# Patient Record
Sex: Male | Born: 2000 | Race: White | Hispanic: No | Marital: Single
Health system: Southern US, Community
[De-identification: ages and names within clinical notes are randomized; demographics above are authoritative.]

## PROBLEM LIST (undated history)

## (undated) DIAGNOSIS — K589 Irritable bowel syndrome without diarrhea: Secondary | ICD-10-CM

## (undated) HISTORY — DX: Irritable bowel syndrome without diarrhea: K58.9

## (undated) HISTORY — PX: FRACTURE SURGERY: SHX138

---
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
## 2021-01-19 ENCOUNTER — Other Ambulatory Visit: Payer: Self-pay | Admitting: Family Medicine

## 2021-01-19 DIAGNOSIS — K219 Gastro-esophageal reflux disease without esophagitis: Secondary | ICD-10-CM

## 2021-02-16 ENCOUNTER — Other Ambulatory Visit: Payer: Self-pay | Admitting: Family Medicine

## 2021-02-16 DIAGNOSIS — K219 Gastro-esophageal reflux disease without esophagitis: Secondary | ICD-10-CM

## 2021-03-17 ENCOUNTER — Encounter: Payer: Self-pay | Admitting: Nurse Practitioner

## 2021-03-17 ENCOUNTER — Ambulatory Visit (INDEPENDENT_AMBULATORY_CARE_PROVIDER_SITE_OTHER): Payer: BC Managed Care – PPO | Admitting: Nurse Practitioner

## 2021-03-17 ENCOUNTER — Other Ambulatory Visit: Payer: Self-pay

## 2021-03-17 VITALS — BP 132/76 | HR 116 | Temp 97.3°F | Ht 70.0 in | Wt 188.0 lb

## 2021-03-17 DIAGNOSIS — H9202 Otalgia, left ear: Secondary | ICD-10-CM

## 2021-03-17 DIAGNOSIS — H6121 Impacted cerumen, right ear: Secondary | ICD-10-CM

## 2021-03-17 MED ORDER — DEBROX 6.5 % OT SOLN: 5.0000 [drp] | Freq: Two times a day (BID) | OTIC | 0 refills | Status: DC

## 2021-03-17 MED ORDER — NEOMYCIN-POLYMYXIN-HC 3.5-10000-1 OT SOLN: 3.0000 [drp] | Freq: Four times a day (QID) | OTIC | 0 refills | Status: DC

## 2021-03-17 NOTE — Assessment & Plan Note (Signed)
Left ear pain not well controlled in the last 24 hours.  Patient reports going to sleep and waking up with a hearing loss.  Assess patient's ear canal tympanic membrane erythematous.  Patient reports straining area excess water trying to flush her ear.  I suspect patient's ear was full with water and because his hearing loss.  Right ear has cerumen impaction.  Ears disimpacted in clinic.  Education provided to patient with printed handouts given.  Advised patient to use Tylenol or ibuprofen for pain.  Neomycin eardrops sent to pharmacy.  Patient knows to follow-up with worsening unresolved symptoms.

## 2021-03-17 NOTE — Patient Instructions (Signed)

## 2021-03-17 NOTE — Progress Notes (Signed)
Acute Office Visit  Subjective:    Patient ID: Kenneth Kim, male    DOB: 05-Sep-2001, 20 y.o.   MRN: [id number]  Chief Complaint  Patient presents with  . Otalgia    Otalgia  There is pain in the left ear. This is a new problem. The current episode started yesterday. The problem occurs every few minutes. The problem has been unchanged. There has been no fever. The pain is mild. Associated symptoms include hearing loss. Pertinent negatives include no coughing, headaches or sore throat. He has tried nothing for the symptoms. There is no history of a chronic ear infection.     Past Medical History:  Diagnosis Date  . IBS (irritable bowel syndrome) 2016    Past Surgical History:  Procedure Laterality Date  . ELBOW FRACTURE SURGERY Left 2006  . FRACTURE SURGERY      Family History  Problem Relation Age of Onset  . Cancer Father        kidney 2014  . Hyperlipidemia Mother   . Diabetes Maternal Grandmother   . Irritable bowel syndrome Maternal Grandmother     Social History   Socioeconomic History  . Marital status: Single    Spouse name: Not on file  . Number of children: Not on file  . Years of education: Not on file  . Highest education level: Not on file  Occupational History  . Not on file  Tobacco Use  . Smoking status: Passive Smoke Exposure - Never Smoker  . Smokeless tobacco: Never Used  Vaping Use  . Vaping Use: Never used  Substance and Sexual Activity  . Alcohol use: No  . Drug use: No  . Sexual activity: Not on file  Other Topics Concern  . Not on file  Social History Narrative   10th grade   Social Determinants of Health   Financial Resource Strain: Not on file  Food Insecurity: Not on file  Transportation Needs: Not on file  Physical Activity: Not on file  Stress: Not on file  Social Connections: Not on file  Intimate Partner Violence: Not on file    Outpatient Medications Prior to Visit  Medication Sig Dispense Refill  .  cetirizine (ZYRTEC) 10 MG tablet TAKE ONE (1) TABLET EACH DAY 30 tablet 5  . omeprazole (PRILOSEC) 20 MG capsule Take 1 capsule (20 mg total) by mouth 2 (two) times daily before a meal. (Needs to be seen before next refill) 60 capsule 0  . linaclotide (LINZESS) 290 MCG CAPS capsule Take 1 capsule (290 mcg total) by mouth daily before breakfast. 90 capsule 1  . ondansetron (ZOFRAN ODT) 4 MG disintegrating tablet Take 1 tablet (4 mg total) by mouth every 8 (eight) hours as needed for nausea or vomiting. 20 tablet 0  . polyethylene glycol powder (GLYCOLAX/MIRALAX) powder Take 17 g by mouth daily. 850 g 1   No facility-administered medications prior to visit.     Review of Systems  HENT: Positive for ear pain and hearing loss. Negative for sore throat.   Respiratory: Negative for cough.   Genitourinary: Negative.   Neurological: Negative for headaches.  Psychiatric/Behavioral: Behavioral problem:   All other systems reviewed and are negative.      Objective:    Physical Exam Vitals and nursing note reviewed.  Constitutional:      Appearance: Normal appearance.  HENT:     Head: Normocephalic.     Right Ear: There is impacted cerumen.     Ears:  Comments: Inflamed ear canal and space around tympanic membrane    Nose: Nose normal.  Eyes:     Conjunctiva/sclera: Conjunctivae normal.  Cardiovascular:     Pulses: Normal pulses.     Heart sounds: Normal heart sounds.  Pulmonary:     Effort: Pulmonary effort is normal.     Breath sounds: Normal breath sounds.  Neurological:     Mental Status: He is alert and oriented to person, place, and time.     BP 132/76   Pulse (!) 116   Temp (!) 97.3 F (36.3 C) (Temporal)   Ht 5\' 10"  (1.778 m)   Wt 188 lb (85.3 kg)   SpO2 99%   BMI 26.98 kg/m  Wt Readings from Last 3 Encounters:  03/17/21 188 lb (85.3 kg) (89 %, Z= 1.24)*  01/10/19 202 lb 3.2 oz (91.7 kg) (97 %, Z= 1.95)*  01/04/19 202 lb 2 oz (91.7 kg) (97 %, Z= 1.95)*   *  Growth percentiles are based on CDC (Boys, 2-20 Years) data.    Health Maintenance Due  Topic Date Due  . Hepatitis C Screening  Never done  . HPV VACCINES (1 - Male 2-dose series) Never done  . HIV Screening  Never done       Topic Date Due  . HPV VACCINES (1 - Male 2-dose series) Never done     Lab Results  Component Value Date   TSH 1.920 06/27/2018   Lab Results  Component Value Date   WBC 6.0 07/20/2018   HGB 15.3 07/20/2018   HCT 44.5 07/20/2018   MCV 78 (L) 07/20/2018   PLT 327 07/20/2018   Lab Results  Component Value Date   NA 141 06/27/2018   K 4.7 06/27/2018   CO2 26 06/27/2018   GLUCOSE 71 06/27/2018   BUN 10 06/27/2018   CREATININE 1.00 06/27/2018   BILITOT 0.4 06/27/2018   ALKPHOS 126 06/27/2018   AST 21 06/27/2018   ALT 24 06/27/2018   PROT 7.4 06/27/2018   ALBUMIN 4.8 06/27/2018   CALCIUM 10.0 06/27/2018   Lab Results  Component Value Date   CHOL 154 09/21/2015   Lab Results  Component Value Date   HDL 52 09/21/2015   Lab Results  Component Value Date   LDLCALC 72 09/21/2015   Lab Results  Component Value Date   TRIG 148 (H) 09/21/2015   Lab Results  Component Value Date   CHOLHDL 3.0 09/21/2015   Lab Results  Component Value Date   HGBA1C 5.2 06/27/2018       Assessment & Plan:  Left ear pain Left ear pain not well controlled in the last 24 hours.  Patient reports going to sleep and waking up with a hearing loss.  Assess patient's ear canal tympanic membrane erythematous.  Patient reports straining area excess water trying to flush her ear.  I suspect patient's ear was full with water and because his hearing loss.  Right ear has cerumen impaction.  Ears disimpacted in clinic.  Education provided to patient with printed handouts given.  Advised patient to use Tylenol or ibuprofen for pain.  Neomycin eardrops sent to pharmacy.  Patient knows to follow-up with worsening unresolved symptoms.  Problem List Items Addressed This  Visit      Nervous and Auditory   Impacted cerumen of right ear   Relevant Medications   carbamide peroxide (DEBROX) 6.5 % OTIC solution     Other   Left ear pain - Primary  Relevant Medications   neomycin-polymyxin-hydrocortisone (CORTISPORIN) OTIC solution       Meds ordered this encounter  Medications  . neomycin-polymyxin-hydrocortisone (CORTISPORIN) OTIC solution    Sig: Place 3 drops into the left ear 4 (four) times daily.    Dispense:  4 mL    Refill:  0    Order Specific Question:   Supervising Provider    Answer:   Raliegh Ip [[id number]]  . carbamide peroxide (DEBROX) 6.5 % OTIC solution    Sig: Place 5 drops into the right ear 2 (two) times daily.    Dispense:  15 mL    Refill:  0    Order Specific Question:   Supervising Provider    Answer:   Raliegh Ip [[id number]]     Daryll Drown, NP

## 2021-03-24 ENCOUNTER — Ambulatory Visit: Payer: BC Managed Care – PPO | Admitting: Family Medicine

## 2021-03-24 ENCOUNTER — Encounter: Payer: Self-pay | Admitting: Family Medicine

## 2021-03-24 DIAGNOSIS — H6983 Other specified disorders of Eustachian tube, bilateral: Secondary | ICD-10-CM

## 2021-03-24 MED ORDER — AZELASTINE HCL 0.1 % NA SOLN: 2.0000 | Freq: Two times a day (BID) | NASAL | 12 refills | Status: DC

## 2021-03-24 NOTE — Progress Notes (Signed)
   Virtual Visit  Note Due to COVID-19 pandemic this visit was conducted virtually. This visit type was conducted due to national recommendations for restrictions regarding the COVID-19 Pandemic (e.g. social distancing, sheltering in place) in an effort to limit this patient's exposure and mitigate transmission in our community. All issues noted in this document were discussed and addressed.  A physical exam was not performed with this format.  I connected with [name] on 03/24/21 at 1402 by telephone and verified that I am speaking with the correct person using two identifiers. Zylen Wenig is currently located at home and no one is currently with him during the visit. The provider, Gabriel Earing, FNP is located in their office at time of visit.  I discussed the limitations, risks, security and privacy concerns of performing an evaluation and management service by telephone and the availability of in person appointments. I also discussed with the patient that there may be a patient responsible charge related to this service. The patient expressed understanding and agreed to proceed.  CC: ear congestion  History and Present Illness:  HPI  Cordarius reports ear congestion x 1 week. His hearing is muffled at times. The right ear seems worse than the left. He denies nasal congestion, tenderness, fever, chills, drainage, or ear pain. He has a history of seasonal allergies for which he takes zyrtec and Flonase for daily. He was having ear pain 2 weeks ago and he had an appointment last week where he has his ears flushed about at was given ear drops for otitis externa. The pain resolved after the ear drops. He did have nasal congestion as well 2 weeks ago, but this has also resolved.     ROS  As per HPI.    Observations/Objective: Alert and oriented x 3. Able to speak in full sentences without difficulty.    Assessment and Plan: Wasyl was seen today for ear fullness.  Diagnoses  and all orders for this visit:  Eustachian tube dysfunction, bilateral Continue flonase and zyrtec. Add astelin spray as below. Return to office for new or worsening symptoms, or if symptoms persist.  -     azelastine (ASTELIN) 0.1 % nasal spray; Place 2 sprays into both nostrils 2 (two) times daily. Use in each nostril as directed     Follow Up Instructions: As needed.     I discussed the assessment and treatment plan with the patient. The patient was provided an opportunity to ask questions and all were answered. The patient agreed with the plan and demonstrated an understanding of the instructions.   The patient was advised to call back or seek an in-person evaluation if the symptoms worsen or if the condition fails to improve as anticipated.  The above assessment and management plan was discussed with the patient. The patient verbalized understanding of and has agreed to the management plan. Patient is aware to call the clinic if symptoms persist or worsen. Patient is aware when to return to the clinic for a follow-up visit. Patient educated on when it is appropriate to go to the emergency department.   Time call ended:  1413  I provided 11 minutes of  non face-to-face time during this encounter.    Gabriel Earing, FNP

## 2021-03-29 ENCOUNTER — Ambulatory Visit: Payer: BC Managed Care – PPO | Admitting: Family Medicine

## 2021-03-29 ENCOUNTER — Encounter: Payer: Self-pay | Admitting: Family Medicine

## 2021-03-29 ENCOUNTER — Other Ambulatory Visit: Payer: Self-pay

## 2021-03-29 VITALS — BP 136/82 | HR 93 | Ht 70.0 in | Wt 186.0 lb

## 2021-03-29 DIAGNOSIS — H938X1 Other specified disorders of right ear: Secondary | ICD-10-CM | POA: Diagnosis not present

## 2021-03-29 MED ORDER — PREDNISONE 20 MG PO TABS: ORAL_TABLET | ORAL | 0 refills | Status: DC

## 2021-03-29 NOTE — Progress Notes (Signed)
BP 136/82   Pulse 93   Ht 5\' 10"  (1.778 m)   Wt 186 lb (84.4 kg)   SpO2 97%   BMI 26.69 kg/m    Subjective:   Patient ID: , male    DOB: 2000-12-25, 20 y.o.   MRN: [id number]  HPI: Kenneth Kim is a 20 y.o. male presenting on 03/29/2021 for Ear Fullness (Right ear. Muffled sound. Denies swimming or getting water in ear from shower. Not using Rx ear drops prescribed by Tiffany. Just debrox.)   HPI Patient is coming in with complaint of ear pressure and congestion and decreased hearing on the right ear.  He says it feels like it is muffled.  He says been going on for 2 weeks.  He did a virtual visit last week and was given Astelin nasal spray and has been using it and feels like he got a little help from it but not much.  He denies any fevers or chills or sick contacts.  He denies any cough or congestion or sore throat.  He denies any shortness of breath or wheezing.  Relevant past medical, surgical, family and social history reviewed and updated as indicated. Interim medical history since our last visit reviewed. Allergies and medications reviewed and updated.  Review of Systems  Constitutional: Negative for chills and fever.  HENT: Positive for congestion, ear pain and hearing loss. Negative for ear discharge, postnasal drip, rhinorrhea, sinus pressure, sinus pain, sneezing, sore throat and voice change.   Respiratory: Negative for cough, shortness of breath and wheezing.   Cardiovascular: Negative for chest pain and leg swelling.  Musculoskeletal: Negative for myalgias.  Skin: Negative for rash.  All other systems reviewed and are negative.   Per HPI unless specifically indicated above   Allergies as of 03/29/2021   No Known Allergies     Medication List       Accurate as of March 29, 2021  3:00 PM. If you have any questions, ask your nurse or doctor.        azelastine 0.1 % nasal spray Commonly known as: ASTELIN Place 2 sprays into both nostrils  2 (two) times daily. Use in each nostril as directed   cetirizine 10 MG tablet Commonly known as: ZYRTEC TAKE ONE (1) TABLET EACH DAY   Debrox 6.5 % OTIC solution Generic drug: carbamide peroxide Place 5 drops into the right ear 2 (two) times daily.   neomycin-polymyxin-hydrocortisone OTIC solution Commonly known as: CORTISPORIN Place 3 drops into the left ear 4 (four) times daily.   omeprazole 20 MG capsule Commonly known as: PRILOSEC Take 1 capsule (20 mg total) by mouth 2 (two) times daily before a meal. (Needs to be seen before next refill)   predniSONE 20 MG tablet Commonly known as: DELTASONE 2 po at same time daily for 5 days Started by: March 31, 2021 Vernica Wachtel, MD        Objective:   BP 136/82   Pulse 93   Ht 5\' 10"  (1.778 m)   Wt 186 lb (84.4 kg)   SpO2 97%   BMI 26.69 kg/m   Wt Readings from Last 3 Encounters:  03/29/21 186 lb (84.4 kg) (88 %, Z= 1.18)*  03/17/21 188 lb (85.3 kg) (89 %, Z= 1.24)*  01/10/19 202 lb 3.2 oz (91.7 kg) (97 %, Z= 1.95)*   * Growth percentiles are based on CDC (Boys, 2-20 Years) data.    Physical Exam Vitals and nursing note reviewed.  Constitutional:  General: He is not in acute distress.    Appearance: He is well-developed. He is not diaphoretic.  HENT:     Right Ear: Ear canal normal. No drainage, swelling or tenderness. No middle ear effusion. There is no impacted cerumen. Tympanic membrane is retracted. Tympanic membrane is not injected, scarred, perforated, erythematous or bulging.     Left Ear: Tympanic membrane and ear canal normal. No drainage, swelling or tenderness.  No middle ear effusion. There is no impacted cerumen. Tympanic membrane is not injected, scarred, perforated, erythematous, retracted or bulging.     Mouth/Throat:     Mouth: Mucous membranes are moist.     Pharynx: No oropharyngeal exudate or posterior oropharyngeal erythema.  Eyes:     General: No scleral icterus.       Right eye: No discharge.      Conjunctiva/sclera: Conjunctivae normal.     Pupils: Pupils are equal, round, and reactive to light.  Neck:     Thyroid: No thyromegaly.  Cardiovascular:     Rate and Rhythm: Normal rate and regular rhythm.     Heart sounds: Normal heart sounds. No murmur heard.   Pulmonary:     Effort: Pulmonary effort is normal. No respiratory distress.     Breath sounds: Normal breath sounds. No wheezing.  Musculoskeletal:        General: Normal range of motion.     Cervical back: Neck supple.  Lymphadenopathy:     Cervical: No cervical adenopathy.  Skin:    General: Skin is warm and dry.     Findings: No rash.  Neurological:     Mental Status: He is alert and oriented to person, place, and time.     Coordination: Coordination normal.  Psychiatric:        Behavior: Behavior normal.       Assessment & Plan:   Problem List Items Addressed This Visit   None   Visit Diagnoses    Congestion of right ear    -  Primary   Relevant Medications   predniSONE (DELTASONE) 20 MG tablet      Has already tried over-the-counter medicines, will do short course of prednisone recommended Mucinex as well to help with the symptoms.  Call back if worsens or not improving. Follow up plan: Return if symptoms worsen or fail to improve.  Counseling provided for all of the vaccine components No orders of the defined types were placed in this encounter.   Arville Care, MD [facility] 03/29/2021, 3:00 PM

## 2021-05-25 ENCOUNTER — Other Ambulatory Visit: Payer: Self-pay | Admitting: Family Medicine

## 2022-02-10 ENCOUNTER — Other Ambulatory Visit: Payer: Self-pay | Admitting: Family Medicine

## 2022-03-25 ENCOUNTER — Other Ambulatory Visit: Payer: Self-pay | Admitting: Family Medicine

## 2022-03-28 NOTE — Telephone Encounter (Signed)
Dettinger. NTBS Last OV 03/29/21 30 days was given ?

## 2022-03-28 NOTE — Telephone Encounter (Signed)
Appt made fro 6/7  ?

## 2022-05-11 ENCOUNTER — Ambulatory Visit: Payer: BC Managed Care – PPO | Admitting: Family Medicine

## 2022-05-11 ENCOUNTER — Other Ambulatory Visit: Payer: Self-pay | Admitting: Family Medicine

## 2022-05-11 DIAGNOSIS — K219 Gastro-esophageal reflux disease without esophagitis: Secondary | ICD-10-CM

## 2022-07-06 ENCOUNTER — Ambulatory Visit: Payer: BC Managed Care – PPO | Admitting: Family Medicine

## 2022-07-06 ENCOUNTER — Encounter: Payer: Self-pay | Admitting: Family Medicine

## 2022-07-06 VITALS — BP 121/68 | HR 67 | Temp 98.0°F | Wt 176.0 lb

## 2022-07-06 DIAGNOSIS — K219 Gastro-esophageal reflux disease without esophagitis: Secondary | ICD-10-CM | POA: Diagnosis not present

## 2022-07-06 DIAGNOSIS — J302 Other seasonal allergic rhinitis: Secondary | ICD-10-CM

## 2022-07-06 MED ORDER — CETIRIZINE HCL 10 MG PO TABS: 10.0000 mg | ORAL_TABLET | Freq: Every day | ORAL | 3 refills | Status: DC

## 2022-07-06 MED ORDER — OMEPRAZOLE 20 MG PO CPDR: 20.0000 mg | DELAYED_RELEASE_CAPSULE | Freq: Two times a day (BID) | ORAL | 3 refills | Status: DC

## 2022-07-06 NOTE — Progress Notes (Signed)
BP 121/68   Pulse 67   Temp 98 F (36.7 C)   Wt 176 lb (79.8 kg)   SpO2 97%   BMI 25.25 kg/m    Subjective:   Patient ID: Kenneth Kim, male    DOB: Jul 29, 2001, 21 y.o.   MRN: [id number]  HPI: Kenneth Kim is a 21 y.o. male presenting on 07/06/2022 for Medical Management of Chronic Issues, seasonal allergies, and Gastroesophageal Reflux   HPI GERD Patient is currently on omeprazole.  he denies any major symptoms or abdominal pain or belching or burping. She denies any blood in her stool or lightheadedness or dizziness.   Seasonal allergies recheck Patient currently takes Zyrtec for seasonal allergies and is coming in for recheck of it and refill of the medication.   Relevant past medical, surgical, family and social history reviewed and updated as indicated. Interim medical history since our last visit reviewed. Allergies and medications reviewed and updated.  Review of Systems  Constitutional:  Negative for chills and fever.  Eyes:  Negative for visual disturbance.  Respiratory:  Negative for shortness of breath and wheezing.   Cardiovascular:  Negative for chest pain and leg swelling.  Musculoskeletal:  Negative for back pain and gait problem.  Skin:  Negative for rash.  Neurological:  Negative for dizziness, weakness and light-headedness.  All other systems reviewed and are negative.   Per HPI unless specifically indicated above   Allergies as of 07/06/2022   No Known Allergies      Medication List        Accurate as of July 06, 2022  3:28 PM. If you have any questions, ask your nurse or doctor.          STOP taking these medications    azelastine 0.1 % nasal spray Commonly known as: ASTELIN Stopped by: Kenneth Radon Shanen Norris, MD   Debrox 6.5 % OTIC solution Generic drug: carbamide peroxide Stopped by: Kenneth Pyle, MD   neomycin-polymyxin-hydrocortisone OTIC solution Commonly known as: CORTISPORIN Stopped by: Kenneth Radon Pamela Intrieri, MD    predniSONE 20 MG tablet Commonly known as: DELTASONE Stopped by: Kenneth Radon Tyrika Newman, MD       TAKE these medications    cetirizine 10 MG tablet Commonly known as: ZYRTEC Take 1 tablet (10 mg total) by mouth daily. What changed: See the new instructions. Changed by: Kenneth Radon Zymire Turnbo, MD   omeprazole 20 MG capsule Commonly known as: PRILOSEC Take 1 capsule (20 mg total) by mouth 2 (two) times daily before a meal. What changed: See the new instructions. Changed by: Kenneth Radon Verba Ainley, MD         Objective:   BP 121/68   Pulse 67   Temp 98 F (36.7 C)   Wt 176 lb (79.8 kg)   SpO2 97%   BMI 25.25 kg/m   Wt Readings from Last 3 Encounters:  07/06/22 176 lb (79.8 kg) (77 %, Z= 0.75)*  03/29/21 186 lb (84.4 kg) (88 %, Z= 1.18)*  03/17/21 188 lb (85.3 kg) (89 %, Z= 1.24)*   * Growth percentiles are based on CDC (Boys, 2-20 Years) data.    Physical Exam Vitals and nursing note reviewed.  Constitutional:      General: He is not in acute distress.    Appearance: He is well-developed. He is not diaphoretic.  Eyes:     General: No scleral icterus.    Conjunctiva/sclera: Conjunctivae normal.  Neck:     Thyroid: No thyromegaly.  Cardiovascular:  Rate and Rhythm: Normal rate and regular rhythm.     Heart sounds: Normal heart sounds. No murmur heard. Pulmonary:     Effort: Pulmonary effort is normal. No respiratory distress.     Breath sounds: Normal breath sounds. No wheezing.  Abdominal:     General: Abdomen is flat. Bowel sounds are normal. There is no distension.     Tenderness: There is no abdominal tenderness. There is no guarding or rebound.  Musculoskeletal:        General: Normal range of motion.     Cervical back: Neck supple.  Lymphadenopathy:     Cervical: No cervical adenopathy.  Skin:    General: Skin is warm and dry.     Findings: No rash.  Neurological:     Mental Status: He is alert and oriented to person, place, and time.      Coordination: Coordination normal.  Psychiatric:        Behavior: Behavior normal.       Assessment & Plan:   Problem List Items Addressed This Visit       Respiratory   Other seasonal allergic rhinitis - Primary   Relevant Medications   cetirizine (ZYRTEC) 10 MG tablet     Digestive   Gastroesophageal reflux disease without esophagitis   Relevant Medications   omeprazole (PRILOSEC) 20 MG capsule    No changes, only takes omeprazole and Zyrtec and does need refills, Follow up plan: Return in about 1 year (around 07/07/2023), or if symptoms worsen or fail to improve, for Physical exam and recheck of chronic medical issues.  Counseling provided for all of the vaccine components No orders of the defined types were placed in this encounter.   Kenneth Care, MD [facility] 07/06/2022, 3:28 PM

## 2023-07-12 ENCOUNTER — Encounter: Payer: BC Managed Care – PPO | Admitting: Family Medicine

## 2023-07-12 ENCOUNTER — Encounter: Payer: Self-pay | Admitting: Family Medicine

## 2024-02-17 ENCOUNTER — Other Ambulatory Visit: Payer: Self-pay | Admitting: Family Medicine

## 2024-02-17 DIAGNOSIS — J302 Other seasonal allergic rhinitis: Secondary | ICD-10-CM

## 2024-02-19 NOTE — Telephone Encounter (Signed)
 Appt scheduled for 02/22/24

## 2024-02-19 NOTE — Telephone Encounter (Signed)
 Refills denied, pt hasn't been seen in office since 2023. Needs appt for refills.

## 2024-02-22 ENCOUNTER — Encounter: Payer: Self-pay | Admitting: Family Medicine

## 2024-02-22 ENCOUNTER — Ambulatory Visit: Admitting: Family Medicine

## 2024-02-22 VITALS — BP 136/82 | HR 107 | Temp 96.9°F | Ht 70.0 in | Wt 198.0 lb

## 2024-02-22 DIAGNOSIS — Z114 Encounter for screening for human immunodeficiency virus [HIV]: Secondary | ICD-10-CM

## 2024-02-22 DIAGNOSIS — Z1159 Encounter for screening for other viral diseases: Secondary | ICD-10-CM

## 2024-02-22 DIAGNOSIS — J302 Other seasonal allergic rhinitis: Secondary | ICD-10-CM

## 2024-02-22 DIAGNOSIS — K219 Gastro-esophageal reflux disease without esophagitis: Secondary | ICD-10-CM

## 2024-02-22 MED ORDER — CETIRIZINE HCL 10 MG PO TABS: 10.0000 mg | ORAL_TABLET | Freq: Every day | ORAL | 3 refills | Status: AC

## 2024-02-22 MED ORDER — OMEPRAZOLE 20 MG PO CPDR: 20.0000 mg | DELAYED_RELEASE_CAPSULE | Freq: Two times a day (BID) | ORAL | 3 refills | Status: DC

## 2024-02-22 NOTE — Progress Notes (Signed)
 BP 136/82   Pulse (!) 107   Temp (!) 96.9 F (36.1 C)   Ht 5\' 10"  (1.778 m)   Wt 198 lb (89.8 kg)   SpO2 97%   BMI 28.41 kg/m    Subjective:   Patient ID: Kenneth Kim, male    DOB: [birth year], 23 y.o.   MRN: [id number]  HPI: Kenneth Kim is a 23 y.o. male presenting on 02/22/2024 for Medical Management of Chronic Issues, Gastroesophageal Reflux, and seasonal allergies   HPI GERD Patient is currently on omeprazole.  She denies any major symptoms or abdominal pain or belching or burping. She denies any blood in her stool or lightheadedness or dizziness.   Allergic rhinitis Patient is coming in today for recheck and refill of medications for allergic rhinitis.  He currently uses Zyrtec.  Relevant past medical, surgical, family and social history reviewed and updated as indicated. Interim medical history since our last visit reviewed. Allergies and medications reviewed and updated.  Review of Systems  Constitutional:  Negative for chills and fever.  Eyes:  Negative for visual disturbance.  Respiratory:  Negative for shortness of breath and wheezing.   Cardiovascular:  Negative for chest pain and leg swelling.  Musculoskeletal:  Negative for back pain and gait problem.  Skin:  Negative for rash.  Neurological:  Negative for dizziness and light-headedness.  All other systems reviewed and are negative.   Per HPI unless specifically indicated above   Allergies as of 02/22/2024   No Known Allergies      Medication List        Accurate as of February 22, 2024  4:04 PM. If you have any questions, ask your nurse or doctor.          cetirizine 10 MG tablet Commonly known as: ZYRTEC Take 1 tablet (10 mg total) by mouth daily.   omeprazole 20 MG capsule Commonly known as: PRILOSEC Take 20 mg by mouth daily.   omeprazole 20 MG capsule Commonly known as: PRILOSEC Take 1 capsule (20 mg total) by mouth 2 (two) times daily before a meal.         Objective:    BP 136/82   Pulse (!) 107   Temp (!) 96.9 F (36.1 C)   Ht 5\' 10"  (1.778 m)   Wt 198 lb (89.8 kg)   SpO2 97%   BMI 28.41 kg/m   Wt Readings from Last 3 Encounters:  02/22/24 198 lb (89.8 kg)  07/06/22 176 lb (79.8 kg) (77%, Z= 0.75)*  03/29/21 186 lb (84.4 kg) (88%, Z= 1.18)*   * Growth percentiles are based on CDC (Boys, 2-20 Years) data.    Physical Exam Vitals and nursing note reviewed.  Constitutional:      General: He is not in acute distress.    Appearance: He is well-developed. He is not diaphoretic.  Eyes:     General: No scleral icterus.    Conjunctiva/sclera: Conjunctivae normal.  Neck:     Thyroid: No thyromegaly.  Cardiovascular:     Rate and Rhythm: Normal rate and regular rhythm.     Heart sounds: Normal heart sounds. No murmur heard. Pulmonary:     Effort: Pulmonary effort is normal. No respiratory distress.     Breath sounds: Normal breath sounds. No wheezing.  Musculoskeletal:        General: No swelling. Normal range of motion.     Cervical back: Neck supple.  Lymphadenopathy:     Cervical: No cervical adenopathy.  Skin:  General: Skin is warm and dry.     Findings: No rash.  Neurological:     Mental Status: He is alert and oriented to person, place, and time.     Coordination: Coordination normal.  Psychiatric:        Behavior: Behavior normal.       Assessment & Plan:   Problem List Items Addressed This Visit       Respiratory   Other seasonal allergic rhinitis   Relevant Medications   cetirizine (ZYRTEC) 10 MG tablet     Digestive   Gastroesophageal reflux disease without esophagitis - Primary   Relevant Medications   omeprazole (PRILOSEC) 20 MG capsule   omeprazole (PRILOSEC) 20 MG capsule   Other Visit Diagnoses       Encounter for screening for HIV       Relevant Orders   HIV Antibody (routine testing w rflx)     Encounter for hepatitis C screening test for low risk patient       Relevant Orders   Hepatitis C  Antibody       Continue current medicine, looks today for screening blood work. Follow up plan: Return in about 1 year (around 02/21/2025), or if symptoms worsen or fail to improve, for Physical exam.  Counseling provided for all of the vaccine components Orders Placed This Encounter  Procedures   HIV Antibody (routine testing w rflx)   Hepatitis C Antibody    Arville Care, MD [facility] 02/22/2024, 4:04 PM

## 2024-02-23 LAB — HEPATITIS C ANTIBODY: Hep C Virus Ab: NONREACTIVE

## 2024-02-23 LAB — HIV ANTIBODY (ROUTINE TESTING W REFLEX): HIV Screen 4th Generation wRfx: NONREACTIVE

## 2024-02-26 ENCOUNTER — Encounter: Payer: Self-pay | Admitting: Family Medicine

## 2024-12-02 ENCOUNTER — Ambulatory Visit: Payer: Self-pay

## 2024-12-02 NOTE — Telephone Encounter (Signed)
 FYI Only or Action Required?: FYI only for provider: appointment scheduled on 12/03/24.  Patient was last seen in primary care on 02/22/2024 by Dettinger, Fonda LABOR, MD.  Called Nurse Triage reporting Ear Fullness.  Symptoms began several days ago.  Interventions attempted: Nothing.  Symptoms are: stable.  Triage Disposition: See PCP When Office is Open (Within 3 Days)  Patient/caregiver understands and will follow disposition?: Yes Reason for Disposition  [1] Ear congestion lasts > 3 days AND [2] no improvement after using Care Advice  (Exception: Ear congestion is a chronic symptom.)  Answer Assessment - Initial Assessment Questions Patient reports pulling on ear to relieve the pressure. Thinks has too much ear wax in there. Wants a lavage on both ears.   1. LOCATION: Which ear is involved?       Right ear  2. SENSATION: Describe how the ear feels. (e.g., stuffy, full, plugged).      Feels full, diminished hearing  3. ONSET:  When did the ear symptoms start?       11/28/24  4. PAIN: Do you also have an earache? If Yes, ask: How bad is it? (Scale 0-10; none, mild, moderate or severe)     Denies pain, reports discomfort   5. CAUSE: What do you think is causing the ear congestion? (e.g., common cold, nasal allergies, recent flight, recent snorkeling)     Unsure, denies any nasal symptoms  6. OTHER SYMPTOMS: Do you have any other symptoms? (e.g., ear drainage, hay fever symptoms such as sneezing or a clear nasal discharge; cold symptoms such as a cough or runny nose)     Denies  Protocols used: Ear - Congestion-A-AH  Copied from CRM #[id number]. Topic: Clinical - Red Word Triage >> Dec 02, 2024 12:40 PM Miquel SAILOR wrote: Red Word that prompted transfer to Nurse Triage: PT on RT ear stuffed. Will like ear lavage on both/issue since 12/25. Having trouble Sleeping due to this

## 2024-12-02 NOTE — Telephone Encounter (Signed)
 Noted

## 2024-12-03 ENCOUNTER — Encounter: Payer: Self-pay | Admitting: Nurse Practitioner

## 2024-12-03 ENCOUNTER — Ambulatory Visit: Payer: Self-pay | Admitting: Nurse Practitioner

## 2024-12-03 VITALS — BP 136/77 | HR 73 | Temp 97.8°F | Ht 70.0 in | Wt 211.0 lb

## 2024-12-03 DIAGNOSIS — H6121 Impacted cerumen, right ear: Secondary | ICD-10-CM | POA: Diagnosis not present

## 2024-12-03 NOTE — Patient Instructions (Signed)
 Earwax Buildup, Adult Your ears make something called earwax. It helps keep germs called bacteria away and protects the skin in your ears. Sometimes, too much earwax can build up. This can cause discomfort or make it harder to hear. What are the causes? Earwax buildup can happen when you have too much earwax in your ears. Earwax is made in the outer part of your ear canal. It's supposed to fall out in small amounts over time. But if your ears aren't able to clean themselves like they should, earwax can build up. What increases the risk? You're more likely to get earwax buildup if: You clean your ears with cotton swabs. You pick at your ears. You use earplugs or in-ear headphones a lot. You wear hearing aids. You may also be more likely to get it if: You're male. You're older. Your ears naturally make more earwax. You have narrow ear canals or extra hair in your ears. Your earwax is too thick or sticky. You have eczema. You're dehydrated. This means there's not enough fluid in your body. What are the signs or symptoms? Symptoms of earwax buildup include: Not being able to hear as well. A feeling of fullness in your ear. Feeling like your ear is plugged. Fluid coming from your ear. Ear pain or an itchy ear. Ringing in your ear. Coughing or problems with balance. How is this diagnosed? Earwax buildup may be diagnosed based on your symptoms, medical history, and an ear exam. During the exam, your health care provider will look into your ear with a tool called an otoscope. You may also have tests, such as a hearing test. How is this treated? Earwax buildup may be treated by: Using ear drops. Having the earwax removed by a provider. The provider may: Flush the ear with water. Use a tool called a curette that has a loop on the end. Use a suction device. Having surgery. This may be done in severe cases. Follow these instructions at home:  Cleaning your ears Clean your ears as told  by your provider. You can clean the outside of your ears with a washcloth or tissue. Do not overclean your ears. Do not put anything into your ear unless told. This includes cotton swabs. General instructions Take over-the-counter and prescription medicines only as told by your provider. Drink enough fluid to keep your pee (urine) pale yellow. This helps thin the earwax. If you have hearing aids, clean them as told. Keep all follow-up visits. If earwax builds up in your ears often or if you use hearing aids, ask your provider how often you should have your ears cleaned. Contact a health care provider if: Your ear pain gets worse. You have a fever. You have pus, blood, or other fluid coming from your ear. You have hearing loss. You have ringing in your ears that won't go away. You feel like the room is spinning. This is called vertigo. Your symptoms don't get better with treatment. This information is not intended to replace advice given to you by your health care provider. Make sure you discuss any questions you have with your health care provider. Document Revised: 02/02/2023 Document Reviewed: 02/02/2023 Elsevier Patient Education  2024 [employer].

## 2024-12-03 NOTE — Progress Notes (Signed)
" ° °  Subjective:    Patient ID: Kenneth Kim, male    DOB: 2001-07-25, 23 y.o.   MRN: [id number]   Chief Complaint: Ear Fullness (Right ear)   Ear Fullness  Pertinent negatives include no abdominal pain, headaches or rash.    Right ear feels full. Does not hurt pr say but just feels full. Has trouble hearing out of that ear. Patient Active Problem List   Diagnosis Date Noted   Left ear pain 03/17/2021   Impacted cerumen of right ear 03/17/2021   Hypermobile joints 07/13/2018   Chronic idiopathic constipation 07/13/2018   Knee pain, bilateral 12/22/2015   Overweight, pediatric, BMI (body mass index) > 99% for age 45/14/2016   Gastroesophageal reflux disease without esophagitis 03/19/2015   Other seasonal allergic rhinitis 03/19/2015   IBS (irritable bowel syndrome) 12/05/2014       Review of Systems  Constitutional:  Negative for diaphoresis.  Eyes:  Negative for pain.  Respiratory:  Negative for shortness of breath.   Cardiovascular:  Negative for chest pain, palpitations and leg swelling.  Gastrointestinal:  Negative for abdominal pain.  Endocrine: Negative for polydipsia.  Skin:  Negative for rash.  Neurological:  Negative for dizziness, weakness and headaches.  Hematological:  Does not bruise/bleed easily.  All other systems reviewed and are negative.      Objective:   Physical Exam Constitutional:      Appearance: Normal appearance.  Cardiovascular:     Rate and Rhythm: Normal rate and regular rhythm.     Heart sounds: Normal heart sounds.  Pulmonary:     Breath sounds: Normal breath sounds.  Skin:    General: Skin is warm.  Neurological:     General: No focal deficit present.     Mental Status: He is alert and oriented to person, place, and time.  Psychiatric:        Mood and Affect: Mood normal.        Behavior: Behavior normal.    BP 136/77   Pulse 73   Temp 97.8 F (36.6 C) (Temporal)   Ht 5' 10 (1.778 m)   Wt 211 lb (95.7 kg)   SpO2 96%    BMI 30.28 kg/m    S/p right er irrigation- TM's clear bil     Assessment & Plan:  Kenneth Kim in today with chief complaint of Ear Fullness (Right ear)   1. Impacted cerumen of right ear (Primary) Debrox 2-3 x a week Do not use qtips RTO prn    The above assessment and management plan was discussed with the patient. The patient verbalized understanding of and has agreed to the management plan. Patient is aware to call the clinic if symptoms persist or worsen. Patient is aware when to return to the clinic for a follow-up visit. Patient educated on when it is appropriate to go to the emergency department.   Kenneth Gladis, FNP   "

## 2025-02-24 ENCOUNTER — Encounter: Payer: Self-pay | Admitting: Family Medicine
